# Patient Record
Sex: Female | Born: 1960 | Hispanic: Refuse to answer | Marital: Married | Smoking: Never smoker
Health system: Southern US, Community
[De-identification: ages and names within clinical notes are randomized; demographics above are authoritative.]

## PROBLEM LIST (undated history)

## (undated) HISTORY — PX: BREAST BIOPSY: SHX20

---

## 2016-10-03 ENCOUNTER — Ambulatory Visit (INDEPENDENT_AMBULATORY_CARE_PROVIDER_SITE_OTHER): Payer: BLUE CROSS/BLUE SHIELD | Admitting: Urgent Care

## 2016-10-03 VITALS — BP 162/80 | HR 107 | Temp 98.8°F | Resp 18 | Ht 67.5 in | Wt 173.0 lb

## 2016-10-03 DIAGNOSIS — L0201 Cutaneous abscess of face: Secondary | ICD-10-CM

## 2016-10-03 DIAGNOSIS — R220 Localized swelling, mass and lump, head: Secondary | ICD-10-CM

## 2016-10-03 DIAGNOSIS — R030 Elevated blood-pressure reading, without diagnosis of hypertension: Secondary | ICD-10-CM

## 2016-10-03 LAB — POCT CBC
GRANULOCYTE PERCENT: 8.7 % — AB (ref 37–80)
HEMATOCRIT: 39 % (ref 37.7–47.9)
HEMOGLOBIN: 14 g/dL (ref 12.2–16.2)
LYMPH, POC: 14.3 — AB (ref 0.6–3.4)
MCH, POC: 28 pg (ref 27–31.2)
MCHC: 35.8 g/dL — AB (ref 31.8–35.4)
MCV: 78.1 fL — AB (ref 80–97)
MID (cbc): 78.4 — AB (ref 0–0.9)
MPV: 7.3 fL (ref 0–99.8)
POC GRANULOCYTE: 0.8 — AB (ref 2–6.9)
POC LYMPH PERCENT: 7.3 %L — AB (ref 10–50)
POC MID %: 1.6 %M (ref 0–12)
Platelet Count, POC: 298 10*3/uL (ref 142–424)
RBC: 5 M/uL (ref 4.04–5.48)
RDW, POC: 13.8 %
WBC: 11.1 10*3/uL — AB (ref 4.6–10.2)

## 2016-10-03 MED ORDER — FLUCONAZOLE 150 MG PO TABS: 150.0000 mg | ORAL_TABLET | ORAL | 0 refills | Status: DC

## 2016-10-03 MED ORDER — DOXYCYCLINE HYCLATE 100 MG PO CAPS: 100.0000 mg | ORAL_CAPSULE | Freq: Two times a day (BID) | ORAL | 0 refills | Status: DC

## 2016-10-03 NOTE — Patient Instructions (Addendum)
Change your dressing daily until it stops draining and the wound has closed. You may use ibuprofen 400-600mg  every 6 hours as needed for pain and inflammation.   Skin Abscess A skin abscess is an infected area on or under your skin that contains a collection of pus and other material. An abscess may also be called a furuncle, carbuncle, or boil. An abscess can occur in or on almost any part of your body. Some abscesses break open (rupture) on their own. Most continue to get worse unless they are treated. The infection can spread deeper into the body and eventually into your blood, which can make you feel ill. Treatment usually involves draining the abscess. What are the causes? An abscess occurs when germs, often bacteria, pass through your skin and cause an infection. This may be caused by:  A scrape or cut on your skin.  A puncture wound through your skin, including a needle injection.  Blocked oil or sweat glands.  Blocked and infected hair follicles.  A cyst that forms beneath your skin (sebaceous cyst) and becomes infected. What increases the risk? This condition is more likely to develop in people who:  Have a weak body defense system (immune system).  Have diabetes.  Have dry and irritated skin.  Get frequent injections or use illegal IV drugs.  Have a foreign body in a wound, such as a splinter.  Have problems with their lymph system or veins. What are the signs or symptoms? An abscess may start as a painful, firm bump under the skin. Over time, the abscess may get larger or become softer. Pus may appear at the top of the abscess, causing pressure and pain. It may eventually break through the skin and drain. Other symptoms include:  Redness.  Warmth.  Swelling.  Tenderness.  A sore on the skin. How is this diagnosed? This condition is diagnosed based on your medical history and a physical exam. A sample of pus may be taken from the abscess to find out what is  causing the infection and what antibiotics can be used to treat it. You also may have:  Blood tests to look for signs of infection or spread of an infection to your blood.  Imaging studies such as ultrasound, CT scan, or MRI if the abscess is deep. How is this treated? Small abscesses that drain on their own may not need treatment. Treatment for an abscess that does not rupture on its own may include:  Warm compresses applied to the area several times per day.  Incision and drainage. Your health care provider will make an incision to open the abscess and will remove pus and any foreign body or dead tissue. The incision area may be packed with gauze to keep it open for a few days while it heals.  Antibiotic medicines to treat infection. For a severe abscess, you may first get antibiotics through an IV and then change to oral antibiotics. Follow these instructions at home: Abscess Care  If you have an abscess that has not drained, place a warm, clean, wet washcloth over the abscess several times a day. Do this as told by your health care provider.  Follow instructions from your health care provider about how to take care of your abscess. Make sure you:  Cover the abscess with a bandage (dressing).  Change your dressing or gauze as told by your health care provider.  Wash your hands with soap and water before you change the dressing or gauze. If  soap and water are not available, use hand sanitizer.  Check your abscess every day for signs of a worsening infection. Check for:  More redness, swelling, or pain.  More fluid or blood.  Warmth.  More pus or a bad smell. Medicines  Take over-the-counter and prescription medicines only as told by your health care provider.  If you were prescribed an antibiotic medicine, take it as told by your health care provider. Do not stop taking the antibiotic even if you start to feel better. General instructions  To avoid spreading the  infection:  Do not share personal care items, towels, or hot tubs with others.  Avoid making skin contact with other people.  Keep all follow-up visits as told by your health care provider. This is important. Contact a health care provider if:  You have more redness, swelling, or pain around your abscess.  You have more fluid or blood coming from your abscess.  Your abscess feels warm to the touch.  You have more pus or a bad smell coming from your abscess.  You have a fever.  You have muscle aches.  You have chills or a general ill feeling. Get help right away if:  You have severe pain.  You see red streaks on your skin spreading away from the abscess. This information is not intended to replace advice given to you by your health care provider. Make sure you discuss any questions you have with your health care provider. Document Released: 06/08/2005 Document Revised: 04/24/2016 Document Reviewed: 07/08/2015 Elsevier Interactive Patient Education  2017 [employer].       IF you received an x-ray today, you will receive an invoice from [facility]. Please contact [facility] at [phone] with questions or concerns regarding your invoice.   IF you received labwork today, you will receive an invoice from [facility]. Please contact [facility] at [phone] with questions or concerns regarding your invoice.   Our billing staff will not be able to assist you with questions regarding bills from these companies.  You will be contacted with the lab results as soon as they are available. The fastest way to get your results is to activate your My Chart account. Instructions are located on the last page of this paperwork. If you have not heard from [place] regarding the results in 2 weeks, please contact this office.

## 2016-10-03 NOTE — Progress Notes (Signed)
    MRN: [id number] DOB: 02/18/1963  Subjective:   Stephanie Petersen is a 56 y.o. female presenting for chief complaint of FACIAL INFECTION (UNDER CHIN)  Reports 4 day history of mass over her left chin. There is swelling, pain, redness, drainage of pus and bleeding. Patient did lance the area. Has also tried otc antibiotic cream. Denies fever, lip or gum pain, jaw pain. She does have . Has a history of abscesses, one over the buttock and another over right face as well. Both had I&[place] back in [place], [place].  Stephanie Petersen is not taking any medications. Also is allergic to penicillins.  Stephanie Petersen denies past medical history or previous surgeries.  Objective:   Vitals: BP (!) 162/80 (BP Location: Left Arm, Patient Position: Sitting, Cuff Size: Large)   Pulse (!) 107   Temp 98.8 F (37.1 C) (Oral)   Resp 18   Ht 5' 7.5" (1.715 m)   Wt 173 lb (78.5 kg)   SpO2 97%   BMI 26.70 kg/m    Physical Exam  Constitutional: She is oriented to person, place, and time. She appears well-developed and well-nourished.  HENT:  Head:    Cardiovascular: Normal rate.   Pulmonary/Chest: Effort normal.  Neurological: She is alert and oriented to person, place, and time.   PROCEDURE NOTE: I&D of Abscess Verbal consent obtained. Local anesthesia with 2.5% lidocaine gel. The abscess was lanced using an 18 gauge needle. There was discharge of <0.5cc of pus and serosanguinous fluid. Cleansed and dressed.  Assessment and Plan :   1. Abscess of face 2. Facial swelling - Patient refused incision and drainage initially but eventually agreed to lancing with an 18g needle. Wound culture is pending. Start doxycycline. Change dressing daily. Use diflucan for any antibiotic associated yeast infection.   3. Elevated blood pressure reading without diagnosis of hypertension - Monitor, recheck if it remains elevated s/p treatment of abscess.  Stephanie BambergMario Alohilani Levenhagen, PA-C Primary Care at [facility] [phone] 10/03/2016  11:54 AM

## 2016-10-06 LAB — WOUND CULTURE: ORGANISM ID, BACTERIA: NONE SEEN

## 2017-03-01 ENCOUNTER — Encounter: Payer: Self-pay | Admitting: Physician Assistant

## 2017-03-01 ENCOUNTER — Ambulatory Visit (INDEPENDENT_AMBULATORY_CARE_PROVIDER_SITE_OTHER): Payer: BLUE CROSS/BLUE SHIELD | Admitting: Physician Assistant

## 2017-03-01 VITALS — BP 158/82 | HR 91 | Temp 99.0°F | Resp 18 | Ht 68.11 in | Wt 169.0 lb

## 2017-03-01 DIAGNOSIS — J0140 Acute pansinusitis, unspecified: Secondary | ICD-10-CM | POA: Diagnosis not present

## 2017-03-01 DIAGNOSIS — J3489 Other specified disorders of nose and nasal sinuses: Secondary | ICD-10-CM

## 2017-03-01 DIAGNOSIS — R220 Localized swelling, mass and lump, head: Secondary | ICD-10-CM

## 2017-03-01 MED ORDER — SULFAMETHOXAZOLE-TRIMETHOPRIM 800-160 MG PO TABS: 1.0000 | ORAL_TABLET | Freq: Two times a day (BID) | ORAL | 0 refills | Status: AC

## 2017-03-01 NOTE — Patient Instructions (Addendum)
I am starting you on bactrim.  I am placing an imaging in the early am.  I need you to await contact for this.  I will contact you in regards to these results.       IF you received an x-ray today, you will receive an invoice from [facility]. Please contact [facility] at [phone] with questions or concerns regarding your invoice.   IF you received labwork today, you will receive an invoice from [facility]. Please contact [facility] at [phone] with questions or concerns regarding your invoice.   Our billing staff will not be able to assist you with questions regarding bills from these companies.  You will be contacted with the lab results as soon as they are available. The fastest way to get your results is to activate your My Chart account. Instructions are located on the last page of this paperwork. If you have not heard from us regarding the results in 2 weeks, please contact this office.

## 2017-03-01 NOTE — Progress Notes (Signed)
PRIMARY CARE AT [facility] [address], [place] [phone]  Date:  03/01/2017   Name:  Stephanie Petersen   DOB:  08/11/1961   MRN:  [id number]  PCP:  Patient, No Pcp Per    History of Present Illness:  Stephanie Rudrainey Heyliger is a 56 y.o. female patient who presents to PCP with  Chief Complaint  Patient presents with  . Facial Swelling    with pain x 1 week      Patient had noticed 6 days of swelling of her right nostril that has progressively worsened.  She has noticed blood that is draining at night with purulent fluid.  She has noticed increased swelling along her face just under her eyes.  It is painful along her face, mouth, and maxillary area.  She denies fever or fatigue.    No congestion or difficulty with blowing nose.  No change in vision.   Hx of facial abscess 6 months ago.  Wound culture yielded staph aureus.   There are no active problems to display for this patient.   History reviewed. No pertinent past medical history.  History reviewed. No pertinent surgical history.  Social History  Substance Use Topics  . Smoking status: Never Smoker  . Smokeless tobacco: Never Used  . Alcohol use No    Family History  Problem Relation Age of Onset  . Cancer Father     Allergies  Allergen Reactions  . Penicillins     Medication list has been reviewed and updated.  No current outpatient prescriptions on file prior to visit.   No current facility-administered medications on file prior to visit.     ROS ROS otherwise unremarkable unless listed above.  Physical Examination: BP (!) 170/82   Pulse 91   Temp 99 F (37.2 C) (Oral)   Resp 18   Ht 5' 8.11" (1.73 m)   Wt 169 lb (76.7 kg)   SpO2 98%   BMI 25.61 kg/m  Ideal Body Weight: Weight in (lb) to have BMI = 25: 164.6  Physical Exam  Constitutional: She is oriented to person, place, and time. She appears well-developed and well-nourished. No distress.  HENT:  Head: Normocephalic and atraumatic.   Right Ear: External ear normal.  Left Ear: External ear normal.  Nose: Mucosal edema and rhinorrhea present.  Mild blushing appreciated over the frontal sinus and maxillary area. Swelling just inferior periorbital position bilaterally r>l. Purulent drainage at the right nostril at the lateral area with tenderness along the base of the nostril and septum.  Tender upon palpation of the septum from left nostril as well.   Gingival area normal, however tender above the anterior gumline toward the nose.    Eyes: Conjunctivae and EOM are normal. Pupils are equal, round, and reactive to light.  Cardiovascular: Normal rate and regular rhythm.  Exam reveals no friction rub.   No murmur heard. Pulmonary/Chest: Effort normal. No apnea. No respiratory distress. She has no decreased breath sounds. She has no wheezes.  Neurological: She is alert and oriented to person, place, and time.  Skin: She is not diaphoretic.  Psychiatric: She has a normal mood and affect. Her behavior is normal.     Assessment and Plan: Stephanie Rudrainey Coover is a 56 y.o. female who is here today for cc of nasal drainage and facial swelling Concern of origin of purulent drainage, and swelling.  Will need to insure abscess not apparent and location of site, as well as proximity in the cavernous sinus.  Cellulitic changes, however unsure of extent of infection. Ct imaging obtained tomorrow morning.  Obtaining wound culture.   Start bactrim. Acute non-recurrent pansinusitis - Plan: sulfamethoxazole-trimethoprim (BACTRIM DS,SEPTRA DS) 800-160 MG tablet, WOUND CULTURE, CT MAXILLOFACIAL W & WO CONTRAST, CT Maxillofacial WO CM, CANCELED: CT Maxillofacial WO CM  Trena Platt, PA-C [facility] 6/24/20189:06 AM   Addendum discussed with patient 6/22.  Advised to follow up with Chelle next week.

## 2017-03-02 ENCOUNTER — Telehealth: Payer: Self-pay

## 2017-03-02 ENCOUNTER — Ambulatory Visit (HOSPITAL_COMMUNITY)
Admission: RE | Admit: 2017-03-02 | Discharge: 2017-03-02 | Disposition: A | Discharge status: Home / Self Care | Payer: BLUE CROSS/BLUE SHIELD | Source: Ambulatory Visit | Attending: Physician Assistant | Admitting: Physician Assistant

## 2017-03-02 DIAGNOSIS — J0140 Acute pansinusitis, unspecified: Secondary | ICD-10-CM | POA: Diagnosis not present

## 2017-03-02 DIAGNOSIS — M7989 Other specified soft tissue disorders: Secondary | ICD-10-CM | POA: Diagnosis not present

## 2017-03-02 MED ORDER — IOPAMIDOL (ISOVUE-300) INJECTION 61%
75.0000 mL | Freq: Once | INTRAVENOUS | Status: AC | PRN
  Administered 2017-03-02: 75 mL via INTRAVENOUS

## 2017-03-02 MED ORDER — IOPAMIDOL (ISOVUE-300) INJECTION 61%
INTRAVENOUS | Status: AC
  Filled 2017-03-02: qty 75

## 2017-03-02 NOTE — Telephone Encounter (Signed)
[facility] CT [facility] 1345 [facility]. Spoke with patient and advised of appointment time. Pt aware and stated that she would go. Advised that they needed to go to the radiology department and tell them that she was there for a scheduled CT.

## 2017-03-03 LAB — WOUND CULTURE

## 2017-03-06 ENCOUNTER — Ambulatory Visit (INDEPENDENT_AMBULATORY_CARE_PROVIDER_SITE_OTHER): Payer: BLUE CROSS/BLUE SHIELD | Admitting: Physician Assistant

## 2017-03-06 ENCOUNTER — Encounter: Payer: Self-pay | Admitting: Physician Assistant

## 2017-03-06 VITALS — BP 160/90 | HR 91 | Temp 98.8°F | Resp 18 | Ht 67.0 in | Wt 172.6 lb

## 2017-03-06 DIAGNOSIS — R220 Localized swelling, mass and lump, head: Secondary | ICD-10-CM | POA: Diagnosis not present

## 2017-03-06 DIAGNOSIS — L0201 Cutaneous abscess of face: Secondary | ICD-10-CM | POA: Diagnosis not present

## 2017-03-06 DIAGNOSIS — J3489 Other specified disorders of nose and nasal sinuses: Secondary | ICD-10-CM | POA: Diagnosis not present

## 2017-03-06 NOTE — Patient Instructions (Addendum)
Complete the antibiotic.  We recommend that you schedule a mammogram for breast cancer screening. Typically, you do not need a referral to do this. Please contact a local imaging center to schedule your mammogram.  [facility] - [phone]  *ask for the Radiology Department [facility]) - [phone] or [phone]  [facility] - [phone] [facility] - [phone] [facility] - [phone]) [phone]  *ask for the Radiology Department [facility] - [phone]  *ask for the Radiology Department [facility] - [phone]  *ask for the Mammography Department [facility] - [phone]   IF you received an x-ray today, you will receive an invoice from [facility]. Please contact [facility] at [phone] with questions or concerns regarding your invoice.   IF you received labwork today, you will receive an invoice from [facility]. Please contact [facility] at [phone] with questions or concerns regarding your invoice.   Our billing staff will not be able to assist you with questions regarding bills from these companies.  You will be contacted with the lab results as soon as they are available. The fastest way to get your results is to activate your My Chart account. Instructions are located on the last page of this paperwork. If you have not heard from us regarding the results in 2 weeks, please contact this office.

## 2017-03-06 NOTE — Progress Notes (Signed)
Patient ID: Stephanie Petersen, female    DOB: 11/20/1960, 56 y.o.   MRN: [id number]  PCP: Patient, No Pcp Per  Chief Complaint  Patient presents with  . Sinusitis    03/01/2017 was seen by [place], per pt feels much better.  . Follow-up    Subjective:   Presents for evaluation of sinusitis.  She initially presented here 6/20 with 6 days of nasal swelling and purulent nasal drainage, facial pain. She was treated empirically for sinusitis with SMX-TMP. CT scan was negative for sinusitis, but there was mild right periorbital soft tissue swelling, possibly reflecting cellulitis.   She is feeling much better. Drainage is now clear-yellow in color. No longer needs ibuprofen for facial pain. Tolerating antibiotic without difficulty.    Review of Systems HENT: Negative for congestion, facial swelling and sinus pressure.   Eyes: Negative for photophobia, discharge, redness, itching and visual disturbance.  Respiratory: Negative.  Negative for chest tightness and shortness of breath.   Cardiovascular: Negative for chest pain and palpitations.  Gastrointestinal: Negative for abdominal pain, constipation, diarrhea, nausea and vomiting.  Endocrine: Negative.   Genitourinary: Negative.   Musculoskeletal: Negative.  Negative for neck pain and neck stiffness.  Skin: Positive for wound (healing wound in right nostril).  Allergic/Immunologic: Negative.   Neurological: Negative for dizziness, weakness, light-headedness, numbness and headaches.  Hematological: Negative.   Psychiatric/Behavioral: Negative.      There are no active problems to display for this patient.    Prior to Admission medications   Medication Sig Start Date End Date Taking? Authorizing Provider  sulfamethoxazole-trimethoprim (BACTRIM DS,SEPTRA DS) 800-160 MG tablet Take 1 tablet by mouth 2 (two) times daily. 03/01/17  Yes Trena PlattEnglish, Stephanie D, PA     Allergies  Allergen Reactions  . Penicillins          Objective:  Physical Exam  Constitutional: She is oriented to person, place, and time. She appears well-developed and well-nourished. She is active and cooperative. No distress.  BP (!) 160/90 (BP Location: Left Arm, Patient Position: Sitting, Cuff Size: Normal)   Pulse 91   Temp 98.8 F (37.1 C) (Oral)   Resp 18   Ht 5\' 7"  (1.702 m)   Wt 172 lb 9.6 oz (78.3 kg)   SpO2 97%   BMI 27.03 kg/m   HENT:  Head: Normocephalic and atraumatic.  Right Ear: Hearing and external ear normal.  Left Ear: Hearing and external ear normal.  Nose: Mucosal edema (mild) present. No rhinorrhea.  Mouth/Throat: Oropharynx is clear and moist. No oropharyngeal exudate.  Small crusted lesion of the distal nasal septum on the RIGHT. No erythema. No epistaxis. Mildly tender.  Eyes: Conjunctivae are normal. No scleral icterus.  Neck: Normal range of motion. Neck supple. No thyromegaly present.  Cardiovascular: Normal rate, regular rhythm and normal heart sounds.   Pulses:      Radial pulses are 2+ on the right side, and 2+ on the left side.  Pulmonary/Chest: Effort normal and breath sounds normal.  Lymphadenopathy:       Head (right side): No tonsillar, no preauricular, no posterior auricular and no occipital adenopathy present.       Head (left side): No tonsillar, no preauricular, no posterior auricular and no occipital adenopathy present.    She has no cervical adenopathy.       Right: No supraclavicular adenopathy present.       Left: No supraclavicular adenopathy present.  Neurological: She is alert and oriented to person, place,  and time. No sensory deficit.  Skin: Skin is warm, dry and intact. No rash noted. No cyanosis or erythema. Nails show no clubbing.  Psychiatric: She has a normal mood and affect. Her speech is normal and behavior is normal.           Assessment & Plan:   1. Purulent nasal discharge 2. Facial swelling 3. Abscess of face Improving. Continue antibiotic. Saline spray to  nares.    Return if symptoms worsen or fail to improve.   Fernande Bras, PA-C Primary Care at [facility]

## 2017-03-06 NOTE — Progress Notes (Signed)
Subjective:    Patient ID: Stephanie Petersen, female    DOB: 12/15/1960, 56 y.o.   MRN: [id number]  HPI: 56 y/o F presents for cellulitis of nares bilaterally.  Still draining serosanguinous fluid at night. Not needing ibuprofen for pain anymore.  5 more days of bactrim. Taking as prescribed.  Fever has resolved. No more tenderness of opaque purulent drainage. No difficulty breathing or nasal congestion.  At previous visit on 03/01/2017 the wound was cultured and revealed  Staph aureus that is susceptible to Bactrim. CT maxillofacial with and without contrast IMPRESSION: 1. Mild right periorbital soft tissue swelling may reflect cellulitis. There is no underlying abscess or focal etiology. No postseptal invasion is present. 2. No discrete nasal lesion is evident. 3. No significant sinus disease.  There are no active problems to display for this patient.  No past medical history on file. Prior to Admission medications   Medication Sig Start Date End Date Taking? Authorizing Provider  sulfamethoxazole-trimethoprim (BACTRIM DS,SEPTRA DS) 800-160 MG tablet Take 1 tablet by mouth 2 (two) times daily. 03/01/17  Yes Trena PlattEnglish, Stephanie D, PA   Allergies  Allergen Reactions  . Penicillins      Review of Systems  HENT: Negative for congestion, facial swelling and sinus pressure.   Eyes: Negative for photophobia, discharge, redness, itching and visual disturbance.  Respiratory: Negative.  Negative for chest tightness and shortness of breath.   Cardiovascular: Negative for chest pain and palpitations.  Gastrointestinal: Negative for abdominal pain, constipation, diarrhea, nausea and vomiting.  Endocrine: Negative.   Genitourinary: Negative.   Musculoskeletal: Negative.  Negative for neck pain and neck stiffness.  Skin: Positive for wound (healing wound in right nostril).  Allergic/Immunologic: Negative.   Neurological: Negative for dizziness, weakness, light-headedness, numbness and  headaches.  Hematological: Negative.   Psychiatric/Behavioral: Negative.        Objective:   Physical Exam  Constitutional: She is oriented to person, place, and time. She appears well-developed and well-nourished. No distress.  BP (!) 160/90 (BP Location: Left Arm, Patient Position: Sitting, Cuff Size: Normal)   Pulse 91   Temp 98.8 F (37.1 C) (Oral)   Resp 18   Ht 5\' 7"  (1.702 m)   Wt 172 lb 9.6 oz (78.3 kg)   SpO2 97%   BMI 27.03 kg/m    HENT:  Head: Normocephalic and atraumatic.  Nose: No mucosal edema, rhinorrhea, nose lacerations, sinus tenderness, septal deviation or nasal septal hematoma. No epistaxis.  No foreign bodies. Right sinus exhibits no maxillary sinus tenderness and no frontal sinus tenderness. Left sinus exhibits no maxillary sinus tenderness and no frontal sinus tenderness.    Scabbing 1cm wound inside of right nostril against the septum.   Neck: Normal range of motion. Neck supple. No thyromegaly present.  Cardiovascular: Normal rate, regular rhythm, normal heart sounds and intact distal pulses.   Lymphadenopathy:    She has no cervical adenopathy.  Neurological: She is alert and oriented to person, place, and time.  Skin: Skin is warm and dry. She is not diaphoretic.  Psychiatric: She has a normal mood and affect. Her behavior is normal. Judgment and thought content normal.      Assessment & Plan:  1. Purulent nasal discharge Continue taking Bactrim for the duration of prescription. Stay hydrated, and drink plenty of fluid. When in the sun at the beach this weekend be extra careful to stay protected from the sun as the antibiotic will make your skin more sensitive and more likely to  burn. You can apply Vaseline or triple antibiotic ointment with finger/Qtip as needed.   Return if symptoms worsen or fail to improve.

## 2017-12-12 ENCOUNTER — Encounter: Payer: Self-pay | Admitting: Physician Assistant

## 2018-03-05 ENCOUNTER — Other Ambulatory Visit: Payer: Self-pay | Admitting: Family Medicine

## 2018-03-05 DIAGNOSIS — Z1231 Encounter for screening mammogram for malignant neoplasm of breast: Secondary | ICD-10-CM

## 2018-03-28 ENCOUNTER — Ambulatory Visit
Admission: RE | Admit: 2018-03-28 | Discharge: 2018-03-28 | Disposition: A | Discharge status: Home / Self Care | Payer: BLUE CROSS/BLUE SHIELD | Source: Ambulatory Visit | Attending: Family Medicine | Admitting: Family Medicine

## 2018-03-28 DIAGNOSIS — Z1231 Encounter for screening mammogram for malignant neoplasm of breast: Secondary | ICD-10-CM

## 2018-12-16 IMAGING — CT CT MAXILLOFACIAL WO/W CM
3 of 6 series · 16 of 47 positions shown, 19 images · IV contrast (agent unspecified)
Comparison: None.

CLINICAL DATA: Acute non recurrent pansinusitis. Purulent drainage
from the right nostril. Swelling and erythema of the sinuses.
Question abscess.

EXAM:
CT MAXILLOFACIAL WITHOUT AND WITH CONTRAST
TECHNIQUE: Multidetector CT imaging of the maxillofacial structures was
performed. Multiplanar CT image reconstructions were also generated.
A small metallic BB was placed on the right temple in order to
reliably differentiate right from left.

[Series 3: facial st · axial · 0.34mm/px · z∈[-230,-88]mm · 11 of 83 slices shown, 14 images]
[im 6/83  brain]
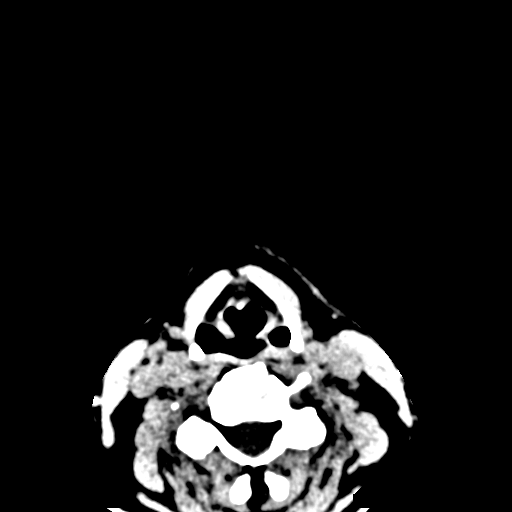
[im 6/83  bone]
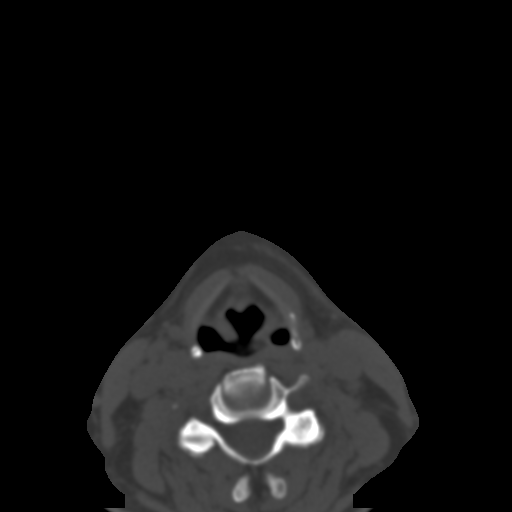
[im 12/83  bone]
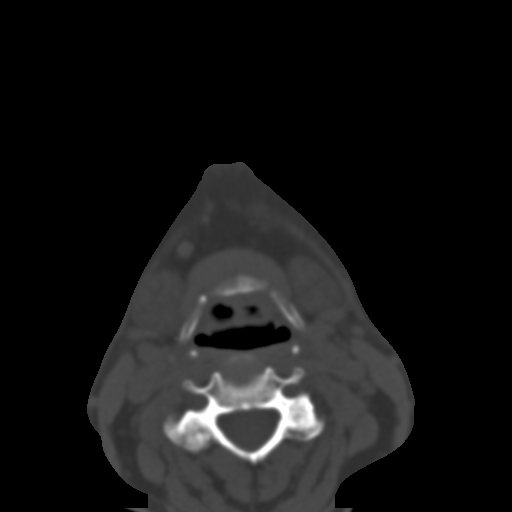
[im 18/83  bone]
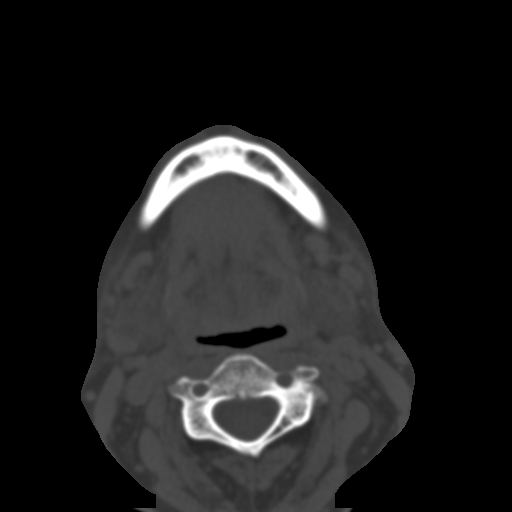
[im 30/83  bone]
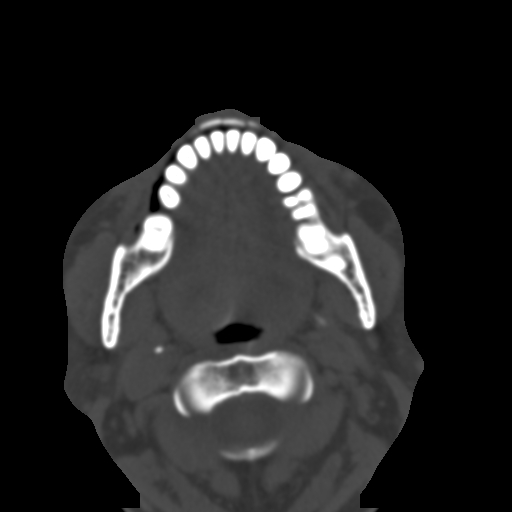
[im 36/83  brain]
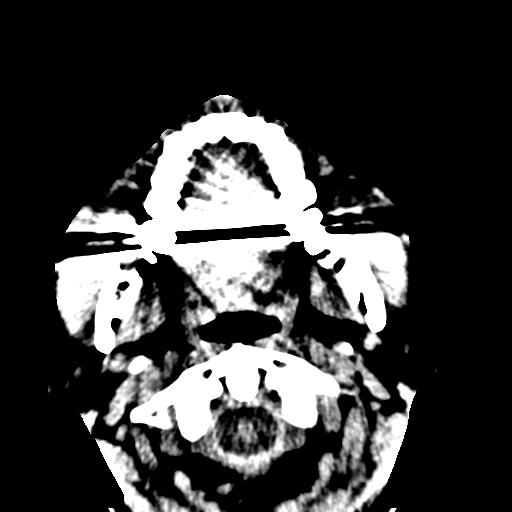
[im 36/83  bone]
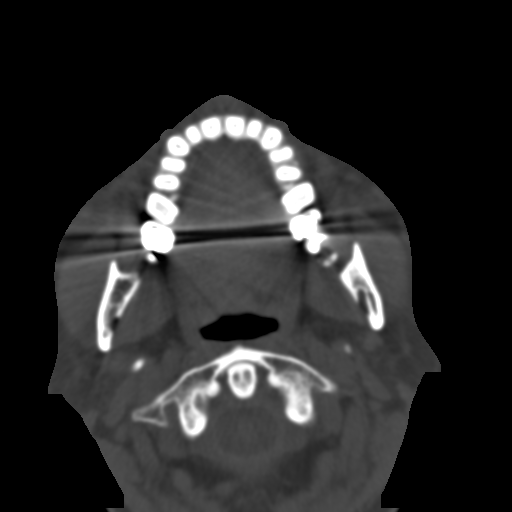
[im 42/83  bone]
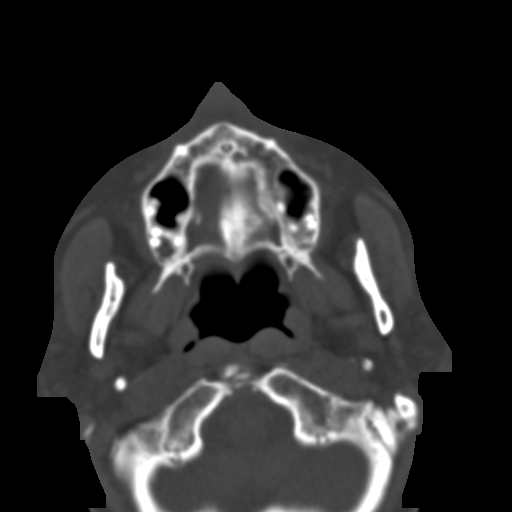
[im 47/83  bone]
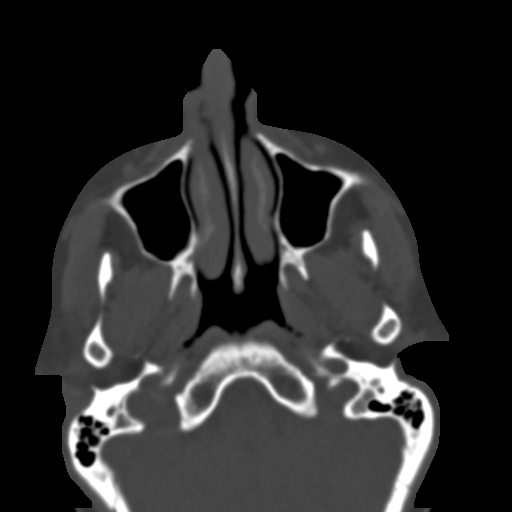
[im 53/83  bone]
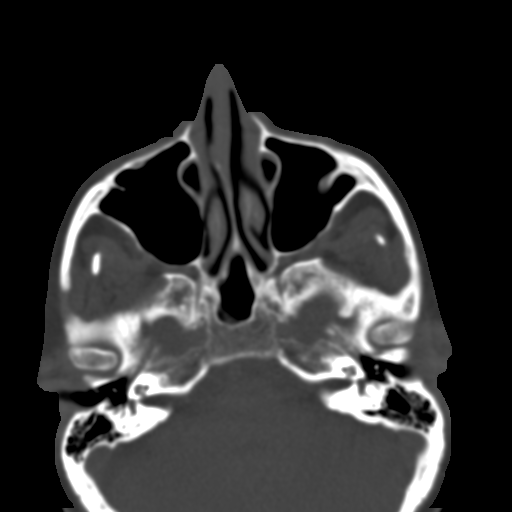
[im 65/83  brain]
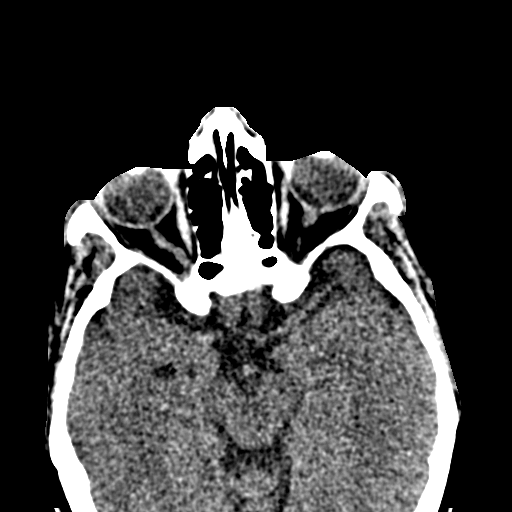
[im 65/83  bone]
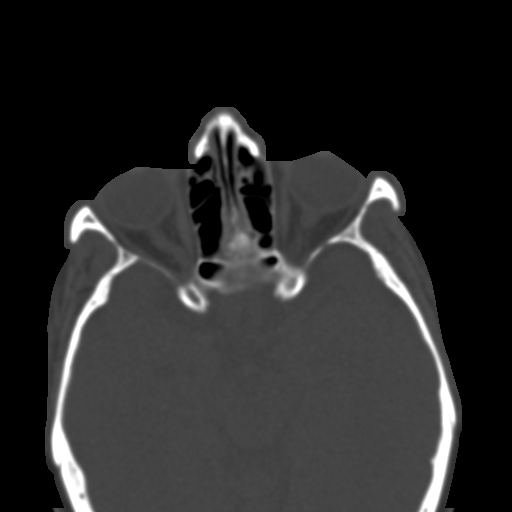
[im 71/83  bone]
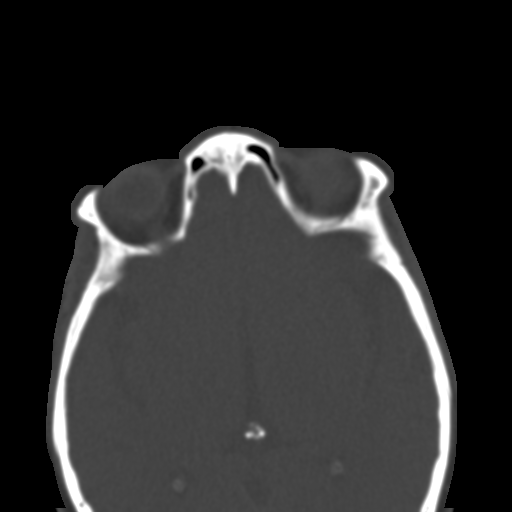
[im 77/83  bone]
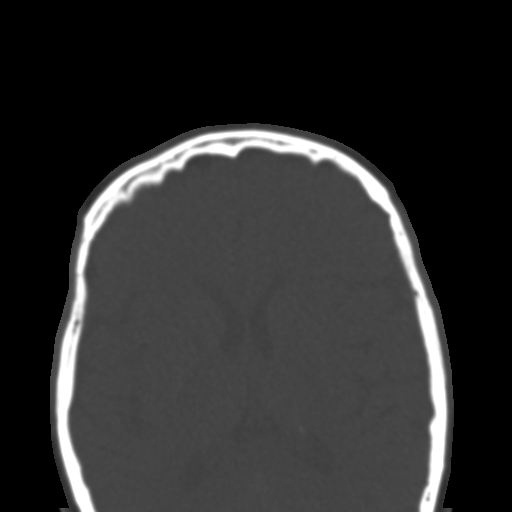

[Series 5: coronal st · coronal · 0.33mm/px · 3 of 79 slices shown]
[im 20/79  bone]
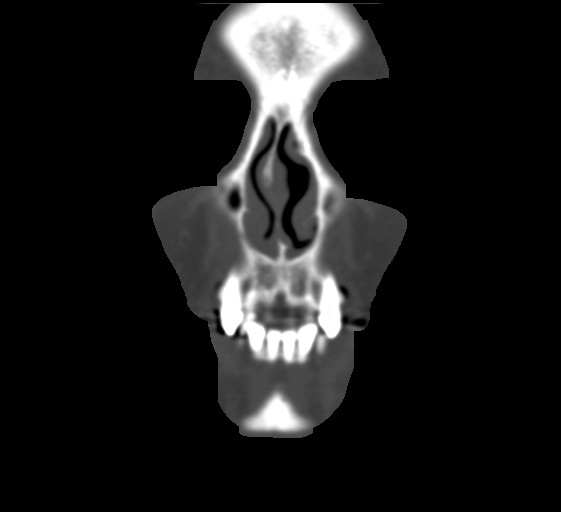
[im 40/79  bone]
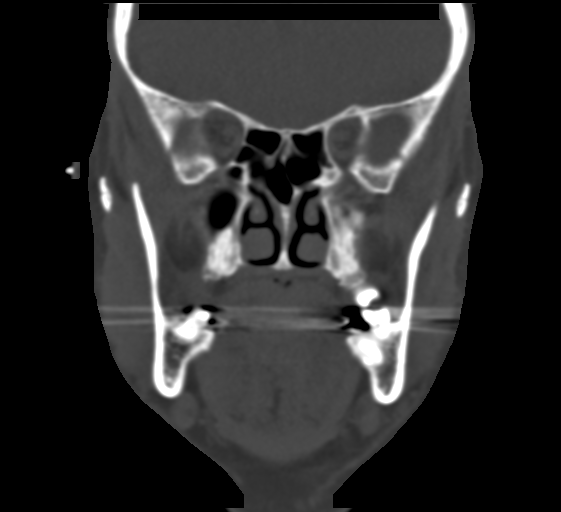
[im 59/79  bone]
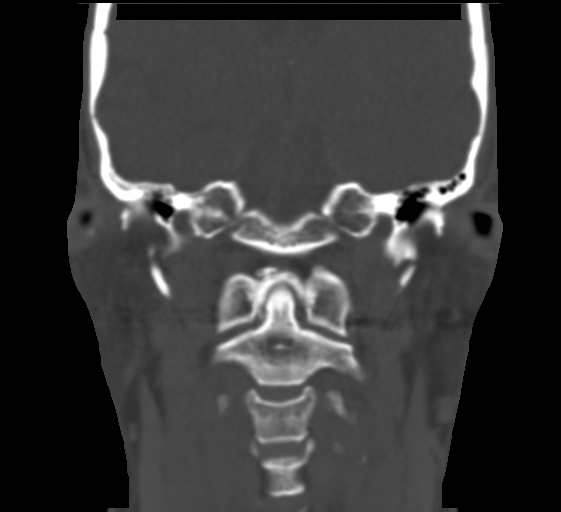

[Series 17: sagittal st · sagittal · 0.33mm/px · 2 of 85 slices shown]
[im 29/85  bone]
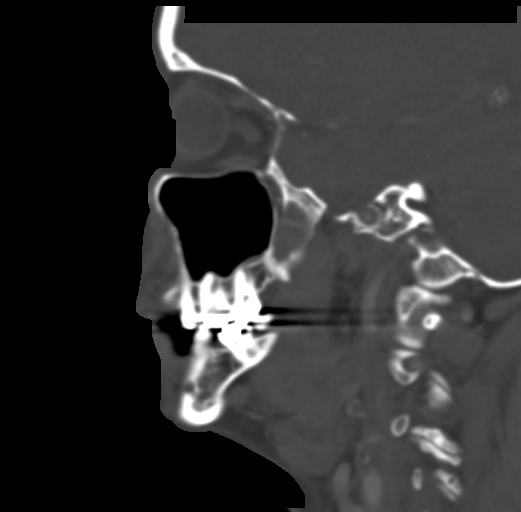
[im 57/85  bone]
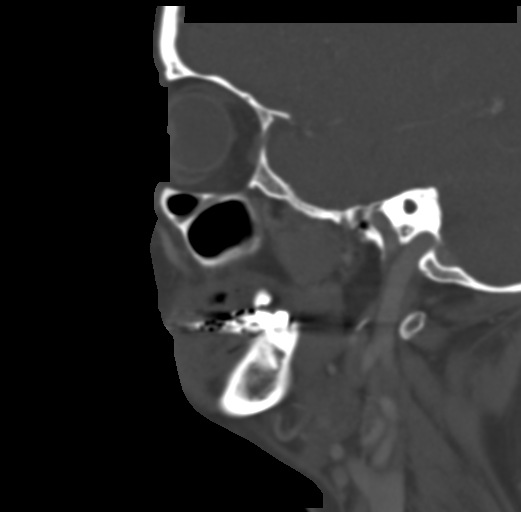

[16 of 47 positions shown; findings below may reference images not displayed]

FINDINGS: Osseous: The facial bones are intact. No focal lytic or blastic
lesions are present. The mandible is intact and located.

Orbits: Mild right periorbital soft tissue swelling is noted. There
is no postseptal or intraorbital inflammatory change. The globes and
orbits are within normal limits.

Sinuses: Minimal mucosal thickening is noted within the inferior
left maxillary sinus. The paranasal sinuses are otherwise clear
bilaterally. There is mild prominence of the ethmoid bulla
bilaterally. There is no obstruction. The nasal cavity is clear.

Soft tissues: Right periorbital soft tissue swelling is noted. No
other focal soft tissue abnormality is present.

Limited intracranial: Within normal limits.
IMPRESSION: 1. Mild right periorbital soft tissue swelling may reflect
cellulitis. There is no underlying abscess or focal etiology. No
postseptal invasion is present.
2. No discrete nasal lesion is evident.
3. No significant sinus disease.
